# Patient Record
Sex: Male | Born: 1980 | Race: White | Hispanic: No | Marital: Married | Smoking: Current every day smoker
Health system: Southern US, Community
[De-identification: ages and names within clinical notes are randomized; demographics above are authoritative.]

---
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
## 2001-09-09 ENCOUNTER — Emergency Department (HOSPITAL_COMMUNITY): Admission: EM | Admit: 2001-09-09 | Discharge: 2001-09-09 | Payer: Self-pay | Admitting: Emergency Medicine

## 2001-09-09 ENCOUNTER — Encounter: Payer: Self-pay | Admitting: Emergency Medicine

## 2002-02-04 ENCOUNTER — Emergency Department (HOSPITAL_COMMUNITY): Admission: EM | Admit: 2002-02-04 | Discharge: 2002-02-04 | Payer: Self-pay | Admitting: Emergency Medicine

## 2002-02-04 ENCOUNTER — Encounter: Payer: Self-pay | Admitting: Emergency Medicine

## 2002-07-12 ENCOUNTER — Emergency Department (HOSPITAL_COMMUNITY): Admission: EM | Admit: 2002-07-12 | Discharge: 2002-07-12 | Payer: Self-pay | Admitting: Emergency Medicine

## 2002-07-12 ENCOUNTER — Encounter: Payer: Self-pay | Admitting: Emergency Medicine

## 2010-01-24 ENCOUNTER — Emergency Department (HOSPITAL_COMMUNITY)
Admission: EM | Admit: 2010-01-24 | Discharge: 2010-01-25 | Payer: Self-pay | Source: Home / Self Care | Admitting: Emergency Medicine

## 2010-02-02 LAB — GLUCOSE, CAPILLARY: Glucose-Capillary: 92 mg/dL (ref 70–99)

## 2010-02-02 LAB — ETHANOL: Alcohol, Ethyl (B): 104 mg/dL — ABNORMAL HIGH (ref 0–10)

## 2010-02-02 LAB — POCT I-STAT, CHEM 8
BUN: 5 mg/dL — ABNORMAL LOW (ref 6–23)
Calcium, Ion: 1.1 mmol/L — ABNORMAL LOW (ref 1.12–1.32)
Chloride: 108 mEq/L (ref 96–112)
Creatinine, Ser: 1 mg/dL (ref 0.4–1.5)
Glucose, Bld: 95 mg/dL (ref 70–99)
HCT: 45 % (ref 39.0–52.0)
Hemoglobin: 15.3 g/dL (ref 13.0–17.0)
Potassium: 3.7 mEq/L (ref 3.5–5.1)
Sodium: 142 mEq/L (ref 135–145)
TCO2: 22 mmol/L (ref 0–100)

## 2010-08-24 ENCOUNTER — Inpatient Hospital Stay (HOSPITAL_COMMUNITY)
Admission: EM | Admit: 2010-08-24 | Discharge: 2010-08-27 | DRG: 101 | Disposition: A | Discharge status: Home / Self Care | Payer: Medicaid Other | Attending: Internal Medicine | Admitting: Internal Medicine

## 2010-08-24 DIAGNOSIS — R21 Rash and other nonspecific skin eruption: Secondary | ICD-10-CM | POA: Diagnosis present

## 2010-08-24 DIAGNOSIS — M6282 Rhabdomyolysis: Secondary | ICD-10-CM | POA: Diagnosis present

## 2010-08-24 DIAGNOSIS — Z91199 Patient's noncompliance with other medical treatment and regimen due to unspecified reason: Secondary | ICD-10-CM

## 2010-08-24 DIAGNOSIS — Z9119 Patient's noncompliance with other medical treatment and regimen: Secondary | ICD-10-CM

## 2010-08-24 DIAGNOSIS — R569 Unspecified convulsions: Principal | ICD-10-CM | POA: Diagnosis present

## 2010-08-25 LAB — COMPREHENSIVE METABOLIC PANEL
ALT: 21 U/L (ref 0–53)
AST: 16 U/L (ref 0–37)
Albumin: 3.9 g/dL (ref 3.5–5.2)
Alkaline Phosphatase: 96 U/L (ref 39–117)
BUN: 6 mg/dL (ref 6–23)
CO2: 23 mEq/L (ref 19–32)
Calcium: 8.9 mg/dL (ref 8.4–10.5)
Chloride: 107 mEq/L (ref 96–112)
Creatinine, Ser: 0.88 mg/dL (ref 0.50–1.35)
GFR calc Af Amer: >60 mL/min (ref >60)
GFR calc non Af Amer: >60 mL/min (ref >60)
Glucose, Bld: 118 mg/dL — ABNORMAL HIGH (ref 70–99)
Potassium: 3.4 mEq/L — ABNORMAL LOW (ref 3.5–5.1)
Sodium: 143 mEq/L (ref 135–145)
Total Bilirubin: 0.2 mg/dL — ABNORMAL LOW (ref 0.3–1.2)
Total Protein: 7.2 g/dL (ref 6.0–8.3)

## 2010-08-25 LAB — GLUCOSE, CAPILLARY: Glucose-Capillary: 129 mg/dL — ABNORMAL HIGH (ref 70–99)

## 2010-08-25 LAB — DIFFERENTIAL
Basophils Absolute: 0.1 10*3/uL (ref 0.0–0.1)
Basophils Relative: 0 % (ref 0–1)
Eosinophils Absolute: 0.1 10*3/uL (ref 0.0–0.7)
Eosinophils Relative: 1 % (ref 0–5)
Lymphocytes Relative: 21 % (ref 12–46)
Lymphs Abs: 2.6 10*3/uL (ref 0.7–4.0)
Monocytes Absolute: 0.7 10*3/uL (ref 0.1–1.0)
Monocytes Relative: 6 % (ref 3–12)
Neutro Abs: 8.7 10*3/uL — ABNORMAL HIGH (ref 1.7–7.7)
Neutrophils Relative %: 72 % (ref 43–77)

## 2010-08-25 LAB — POCT I-STAT, CHEM 8
BUN: 4 mg/dL — ABNORMAL LOW (ref 6–23)
Calcium, Ion: 1.1 mmol/L — ABNORMAL LOW (ref 1.12–1.32)
Chloride: 106 mEq/L (ref 96–112)
Creatinine, Ser: 1.2 mg/dL (ref 0.50–1.35)
Glucose, Bld: 115 mg/dL — ABNORMAL HIGH (ref 70–99)
HCT: 44 % (ref 39.0–52.0)
Hemoglobin: 15 g/dL (ref 13.0–17.0)
Potassium: 3.5 mEq/L (ref 3.5–5.1)
Sodium: 144 mEq/L (ref 135–145)
TCO2: 21 mmol/L (ref 0–100)

## 2010-08-25 LAB — CBC
HCT: 40 % (ref 39.0–52.0)
HCT: 41.8 % (ref 39.0–52.0)
Hemoglobin: 13.9 g/dL (ref 13.0–17.0)
Hemoglobin: 14.9 g/dL (ref 13.0–17.0)
MCH: 30.7 pg (ref 26.0–34.0)
MCH: 31 pg (ref 26.0–34.0)
MCHC: 34.8 g/dL (ref 30.0–36.0)
MCHC: 35.6 g/dL (ref 30.0–36.0)
MCV: 87.1 fL (ref 78.0–100.0)
MCV: 88.3 fL (ref 78.0–100.0)
Platelets: 316 10*3/uL (ref 150–400)
Platelets: 325 10*3/uL (ref 150–400)
RBC: 4.53 MIL/uL (ref 4.22–5.81)
RBC: 4.8 MIL/uL (ref 4.22–5.81)
RDW: 12.9 % (ref 11.5–15.5)
RDW: 13.2 % (ref 11.5–15.5)
WBC: 12.2 10*3/uL — ABNORMAL HIGH (ref 4.0–10.5)
WBC: 13.7 10*3/uL — ABNORMAL HIGH (ref 4.0–10.5)

## 2010-08-25 LAB — ETHANOL: Alcohol, Ethyl (B): 152 mg/dL — ABNORMAL HIGH (ref 0–11)

## 2010-08-25 LAB — BASIC METABOLIC PANEL
BUN: 8 mg/dL (ref 6–23)
CO2: 27 mEq/L (ref 19–32)
Calcium: 9.1 mg/dL (ref 8.4–10.5)
Chloride: 106 mEq/L (ref 96–112)
Creatinine, Ser: 0.82 mg/dL (ref 0.50–1.35)
GFR calc Af Amer: >60 mL/min (ref >60)
GFR calc non Af Amer: >60 mL/min (ref >60)
Glucose, Bld: 95 mg/dL (ref 70–99)
Potassium: 4 mEq/L (ref 3.5–5.1)
Sodium: 142 mEq/L (ref 135–145)

## 2010-08-25 LAB — CK
Total CK: 105 U/L (ref 7–232)
Total CK: 107 U/L (ref 7–232)

## 2010-08-25 LAB — PHENYTOIN LEVEL, TOTAL: Phenytoin Lvl: 9.6 ug/mL — ABNORMAL LOW (ref 10.0–20.0)

## 2010-08-25 LAB — VALPROIC ACID LEVEL
Valproic Acid Lvl: <10 ug/mL — ABNORMAL LOW (ref 50.0–100.0)
Valproic Acid Lvl: <10 ug/mL — ABNORMAL LOW (ref 50.0–100.0)

## 2010-08-25 LAB — LACTIC ACID, PLASMA
Lactic Acid, Venous: 1.2 mmol/L (ref 0.5–2.2)
Lactic Acid, Venous: 3.6 mmol/L — ABNORMAL HIGH (ref 0.5–2.2)

## 2010-08-26 LAB — DRUGS OF ABUSE SCREEN W/O ALC, ROUTINE URINE
Amphetamine Screen, Ur: NEGATIVE
Barbiturate Quant, Ur: NEGATIVE
Benzodiazepines.: POSITIVE — AB
Cocaine Metabolites: POSITIVE — AB
Creatinine,U: 123.7 mg/dL
Marijuana Metabolite: NEGATIVE
Methadone: NEGATIVE
Opiate Screen, Urine: NEGATIVE
Phencyclidine (PCP): NEGATIVE
Propoxyphene: NEGATIVE

## 2010-08-26 LAB — VALPROIC ACID LEVEL: Valproic Acid Lvl: <10 ug/mL — ABNORMAL LOW (ref 50.0–100.0)

## 2010-08-27 DIAGNOSIS — F329 Major depressive disorder, single episode, unspecified: Secondary | ICD-10-CM

## 2010-08-28 LAB — BENZODIAZEPINE, QUANTITATIVE, URINE
Alprazolam (GC/LC/MS), ur confirm: NEGATIVE NG/ML
Nordiazepam GC/MS Conf: NEGATIVE NG/ML
Oxazepam GC/MS Conf: NEGATIVE NG/ML
Temazepam GC/MS Conf: 188 NG/ML — ABNORMAL HIGH

## 2010-08-28 LAB — COCAINE, URINE, CONFIRMATION: Benzoylecgonine GC/MS Conf: 7850 NG/ML — ABNORMAL HIGH

## 2010-08-31 NOTE — Consult Note (Signed)
[name], Vargas NO.:  [id number]  MEDICAL RECORD NO.:  [id number]  LOCATION:  4713                         FACILITY:  [facility]  PHYSICIAN:  Thana Farr, MD    DATE OF BIRTH:  October 06, 1980  DATE OF CONSULTATION:  08/25/2010 DATE OF DISCHARGE:                                CONSULTATION   REASON FOR CONSULTATION:  Seizure in a patient with known seizure disorder and noncompliant with medications.  HISTORY OF PRESENT ILLNESS:  This is a 30 year old Caucasian male with a past medical history of seizure diagnosed approximately 8 months ago by Dr. Terrace Arabia.  The patient has been seen by Dr. Terrace Arabia as an outpatient and was initially placed on 500 mg extended release daily, however, was still showing seizure activity.  On followup visit, the patient's dose was increased to 500 mg extended release b.i.d. by Dr. Terrace Arabia.  The patient was taking the medication up until approximately 3 months ago.  The patient stopped his medication secondary to feeling extremely tired and slightly dizzy.  For 3 months, the patient has been off his medication and states that he has been seizure-free.  Today, the patient returns to the hospital after having multiple seizures at home.  When asked to describe the seizure activity, the patient's girlfriend is unable to explain exactly what happened but states that he did have staring spells and was stiff.  The time-frame is not clear but there is a question of this seizure activity lasting between 10 minutes to an hour.  The patient at this time is alert and coherent showing no seizure activity.  The valproic acid level on admission was less than 10.  PAST MEDICAL HISTORY:  Seizure.  MEDICATIONS:  I have called [facility] and they have told me his Depakote level when last seen in the office was 500 mg extended release Depakote b.i.d. His last office visit was in April 2012.  ALLERGIES:  No known drug allergies.  SOCIAL HISTORY:  The patient is in  [employer].  He does not smoke, drink, or do illicit drugs.  REVIEW OF SYSTEMS:  Positive for headache, otherwise negative in all 12 system reviews.  PHYSICAL EXAMINATION:  VITAL SIGNS:  Blood pressure is 114/65, pulse 95, respiration 18, and temperature 98.4. NEUROLOGIC:  The patient is alert and oriented x3.  Carries out 2 and 3- step commands with difficulty.  Pupils are equal, round, and react to light and accommodating. Conjugate.  Extraocular movements are intact. Visual fields are grossly intact.  Face is symmetrical.  Tongue is midline.  Uvula is midline.  The patient shows no dysarthria, no aphasia.  The facial sensation is full to pinprick and light touch. Shoulder shrug and head turn is within normal limits.  Coordination: Finger-to-nose and heel-to-shin are smooth.  Motor:  The patient shows 5/5 strength throughout the upper extremities and lower extremities bilaterally.  He has normal tone and bulk.  Deep tendon reflexes are 2+ throughout downgoing toes.  Drift:  Negative drift in the upper and lower extremities.  Sensation is full to pinprick and light touch throughout. PULMONARY:  Clear to auscultation. CARDIOVASCULAR:  S1-S2 is regular. NECK:  Negative for bruits.  LABORATORY DATA:  Sodium is 143, potassium 3.4, chloride 107, CO2 is 23, BUN is 6, creatinine 0.8, and glucose is 118.  White blood cell count is 12.2, hemoglobin 14.9, hematocrit 41.8, and platelets are 325.  IMAGING:  CT of head is within normal limits.  ASSESSMENT/PLAN:  This is a 30 year old male with no seizure disorder who has been noncompliant with his medications over the past 3 months now presenting with a seizure.  The patient has been loaded with Depakote 1 gram IV now.  Our recommendations at this time are to start lamictal 50 mg bid as he states he felt Depakote was causing side effects when he was taking the medication.  He is to follow up at [facility] with Dr. Terrace Arabia  within the next 2 weeks to further evaluate medication dosage and also antiepileptic drug preference as the patient states he dislikes the side effects of Depakote.  I have discussed these findings with Dr. Thad Ranger and she agrees with the above-mentioned.     Felicie Morn, PA-C   ______________________________ Thana Farr, MD    DS/MEDQ  D:  08/25/2010  T:  08/25/2010  Job:  [id number]  Electronically Signed by Felicie Morn PA-C on 08/26/2010 10:36:19 AM Electronically Signed by Thana Farr MD on 08/31/2010 01:14:51 AM

## 2010-09-01 NOTE — Discharge Summary (Signed)
  Jeffrey Vargas, Jeffrey Vargas NO.:  [id number]  MEDICAL RECORD NO.:  [id number]  LOCATION:  3031                         FACILITY:  [facility]  PHYSICIAN:  Lonia Blood, M.D.       DATE OF BIRTH:  1980-03-10  DATE OF ADMISSION:  08/24/2010 DATE OF DISCHARGE:  08/27/2010                              DISCHARGE SUMMARY   PRIMARY CARE PHYSICIAN:  [facility].  DISCHARGE DIAGNOSES: 1. 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 2. Depression and multiple social stressors. 3. Alcohol use.  DISCHARGE MEDICATIONS: 1. Keppra 500 grams twice a day. 2. Celexa 20 mg daily.  CONDITION ON DISCHARGE:  The patient was discharged in good condition, alert and oriented, no acute distress.  Temperature 98.0, heart rate 70, respirations 19, blood pressure 113/77, saturation 98% on room air.  The patient will follow up with his primary care physician.  PROCEDURE DURING THIS ADMISSION:  No procedures done.  CONSULTATION:  The patient was seen in consultation by Dr. Thana Farr from Neurology.  HISTORY AND PHYSICAL:  Refer to dictated H and P done by Dr. Conley Rolls.  HOSPITAL COURSE:  Mr. Weekly, 30 year old gentleman with reported history of seizures, was presented to emergency room with what seemed to be a recurrent seizure events.  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  He was placed initially on Lamictal and Dilantin, but then he developed a mild rash.  He was taken off of the Lamictal and Dilantin and placed on Keppra.  The patient was seen in consultation by Dr. Rogers Blocker from Psychiatry, who recommended Celexa 20 mg daily for depression.  On the night of August 27, 2010, the patient requested to be discharged as he was seizure-free for 24 hours and feeling better.  He was told not to drive and to follow up closely with  his primary care physician.  In the long run, if he does not have any seizure in 6 months, his Keppra should be discontinued.     Lonia Blood, M.D.     SL/MEDQ  D:  08/28/2010  T:  08/28/2010  Job:  [id number]  cc:   [facility].  Electronically Signed by Lonia Blood M.D. on 09/01/2010 05:45:29 PM

## 2010-09-09 NOTE — H&P (Signed)
Jeffrey Vargas, Jeffrey Vargas NO.:  [id number]  MEDICAL RECORD NO.:  [id number]  LOCATION:  [facility]                         FACILITY:  [facility]  PHYSICIAN:  Houston Siren, MD           DATE OF BIRTH:  28-Jun-1980  DATE OF ADMISSION:  08/24/2010 DATE OF DISCHARGE:                             HISTORY & PHYSICAL   PRIMARY CARE PHYSICIAN:  None.  REASON FOR ADMISSION:  Status epilepticus.  ADVANCE DIRECTIVES:  Full code.  HISTORY OF PRESENT ILLNESS:  This is a 30 year old male with history of seizure, noncompliant with his medications, last seizure 3 months ago, stopped all his medications, presents in status epilepticus for 45 minutes.  He was given Valium 5 mg IV x2 and when he was in the emergency room, another Ativan 4 mg in total was given as well.  He was in postictal, but when I saw him he became alert and conversing.  He was loaded with 1000 mg of Dilantin IV.  Other workup included an alcohol level of greater than 150.  Lactic acid of 3.6, creatinine of 0.88, Depakote less than 10, white count of 12.2,000 and hemoglobin of 14.9. Hospitalist was asked to admit the patient for observation.  PAST MEDICAL HISTORY:  Seizure.  MEDICATIONS:  Depakote 500 mg extended release once a day.  SOCIAL HISTORY:  He worked in [employer].  He is married.  He deny regular alcohol use or any drug use.  FAMILY HISTORY:  Noncontributory.  REVIEW OF SYSTEMS:  Otherwise unremarkable.  He does have mild headache.  PHYSICAL EXAMINATION:  VITAL SIGNS:  Blood pressure 108/56, pulse of 90, respiratory rate of 19, temperature 98.1 rectally. GENERAL:  He is alert and oriented and conversing.  He know he is at [facility] and able to carry meaningful conversation, but he does appear to be fatigued. HEENT:  Pupils equal, round and reactive bilaterally.  Sclerae are nonicteric.  Throat is clear. NECK:  Supple.  Uvula elevated with phonation.  Speech is fluent.  No carotid bruit. CARDIAC:  S1,  S2 regular.  I did not hear any murmur, rub or gallop. LUNGS:  Clear.  No wheezes, rales or any evidence of consolidation. ABDOMEN:  Soft, nondistended, nontender.  No hepatosplenomegaly. EXTREMITIES:  No edema.  No calf tenderness.  Good distal pulses bilaterally. SKIN:  Warm and dry. NEUROLOGIC:  Nonfocal.  He has good strength bilaterally. PSYCHIATRIC:  Unremarkable as well.  OBJECTIVE FINDINGS:  As above.  Pertinent finding is alcohol level greater than 150, Depakote was less than 10, creatinine is 0.88.  White count of 12.2,000.  IMPRESSION:  This is a 30 year old male in status epilepticus due to noncompliance.  Apparently, he has not had seizure for 3 months and decided that he does not need his Depakote, so he stopped.  We will admit him to telemetry unit for observation.  He is a full code.  We will admit him to [facility].  Dilantin was given as a loading dose and I suspect that this would hold him and his Depakote will be resumed at 500 mg b.i.d.  I will try to avoid given him the loading dose  of Depakote.  If he has an acute seizure in the hospital, we will give benzodiazepine.  I reiterate to him the importance of being compliant and obviously, he cannot drive.  He is stable otherwise, and we will get a CPK level along with giving him intravenous fluid in case he has rhabdomyolysis from his prolonged seizure, which I suspect he will.     Houston Siren, MD     PL/MEDQ  D:  08/25/2010  T:  08/25/2010  Job:  [id number]  Electronically Signed by Houston Siren  on 09/09/2010 05:59:33 AM

## 2011-02-22 ENCOUNTER — Emergency Department (HOSPITAL_COMMUNITY)
Admission: EM | Admit: 2011-02-22 | Discharge: 2011-02-22 | Disposition: A | Discharge status: Home / Self Care | Payer: Medicaid Other | Attending: Emergency Medicine | Admitting: Emergency Medicine

## 2011-02-22 ENCOUNTER — Encounter (HOSPITAL_COMMUNITY): Payer: Self-pay

## 2011-02-22 DIAGNOSIS — M79609 Pain in unspecified limb: Secondary | ICD-10-CM | POA: Insufficient documentation

## 2011-02-22 DIAGNOSIS — J45909 Unspecified asthma, uncomplicated: Secondary | ICD-10-CM | POA: Insufficient documentation

## 2011-02-22 DIAGNOSIS — M545 Low back pain, unspecified: Secondary | ICD-10-CM | POA: Insufficient documentation

## 2011-02-22 MED ORDER — IBUPROFEN 800 MG PO TABS: 800.0000 mg | ORAL_TABLET | Freq: Three times a day (TID) | ORAL | Status: AC

## 2011-02-22 MED ORDER — HYDROMORPHONE HCL PF 1 MG/ML IJ SOLN
1.0000 mg | Freq: Once | INTRAMUSCULAR | Status: AC
  Administered 2011-02-22: 1 mg via INTRAMUSCULAR
  Filled 2011-02-22: qty 1

## 2011-02-22 MED ORDER — KETOROLAC TROMETHAMINE 60 MG/2ML IM SOLN
60.0000 mg | Freq: Once | INTRAMUSCULAR | Status: AC
  Administered 2011-02-22: 60 mg via INTRAMUSCULAR
  Filled 2011-02-22: qty 2

## 2011-02-22 MED ORDER — OXYCODONE HCL 5 MG PO TABS: 5.0000 mg | ORAL_TABLET | ORAL | Status: AC | PRN

## 2011-02-22 NOTE — ED Notes (Signed)
Pt getting dressed to be discharged

## 2011-02-22 NOTE — ED Provider Notes (Signed)
History     CSN: [id number]  Arrival date & time 02/22/11  1341   First MD Initiated Contact with Patient 02/22/11 1515      Chief Complaint  Patient presents with  . Back Pain    (Consider location/radiation/quality/duration/timing/severity/associated sxs/prior treatment) HPI History provided by pt.   Pt presents w/ mid-line low back pain x 2 weeks.  When he bends over or attempts to lift something heavy, he feels a pinching sensation and pain radiates down the lateral aspect of his right leg.  Denies fever, bladder/bowel dysfunction and lower extremity weakness/parasthesias.   Has taken aspirin w/out relief.  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     Past Medical History  Diagnosis Date  . Asthma     History reviewed. No pertinent past surgical history.  History reviewed. No pertinent family history.  History  Substance Use Topics  . Smoking status: Current Everyday Smoker  . Smokeless tobacco: Not on file  . Alcohol Use: No      Review of Systems  All other systems reviewed and are negative.    Allergies  Tylenol  Home Medications   Current Outpatient Rx  Name Route Sig Dispense Refill  . ALBUTEROL SULFATE HFA 108 (90 BASE) MCG/ACT IN AERS Inhalation Inhale 2 puffs into the lungs every 6 (six) hours as needed. For wheezing      BP 134/91  Pulse 83  Temp(Src) 98.2 F (36.8 C) (Oral)  Resp 14  SpO2 97%  Physical Exam  Nursing note and vitals reviewed. Constitutional: He is oriented to person, place, and time. He appears well-developed and well-nourished.  HENT:  Head: Normocephalic and atraumatic.  Eyes:       Normal appearance  Neck: Normal range of motion.  Cardiovascular: Normal rate and regular rhythm.   Pulmonary/Chest: Effort normal and breath sounds normal.  Abdominal: Soft. Bowel sounds are normal. He exhibits no distension.       Mild tenderness lower abd  Musculoskeletal:       Lumbar spinal and paraspinal ttp. No CVA ttp. Full  ROM of LE.  Nml patellar reflexes.  No saddle anesthesia. Distal sensation intact.  2+ DP pulses.   Neurological: He is alert and oriented to person, place, and time.  Skin: Skin is warm and dry. No rash noted.  Psychiatric: He has a normal mood and affect. His behavior is normal.    ED Course  Procedures (including critical care time)  Labs Reviewed - No data to display No results found.   1. Low back pain       MDM  Pt presents w/ low back pain w/ radiation down RLE x 2 weeks.  Attributes to heavy lifting at work. No back pain red flag s/sx.  Pt received IM toradol and dilaudid and pain much improved.  He is ambulating w/out difficulty.  D/c'd home w/ prescription for ibuprofen and oxycodone (allergic to tylenol) and referral to NS for persistent/worsening sx.  Return precautions discussed.         Arie Sabina [place], [place] 02/22/11 [phone]

## 2011-02-22 NOTE — ED Notes (Signed)
Retrieved pt from triage waiting area; pt getting into a gown

## 2011-02-22 NOTE — ED Notes (Signed)
Pt complains of back pain for two weeks. 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

## 2011-02-22 NOTE — ED Provider Notes (Signed)
Medical screening examination/treatment/procedure(s) were performed by non-physician practitioner and as supervising physician I was immediately available for consultation/collaboration.  Shaynna Husby, MD 02/22/11 1942 

## 2011-08-17 ENCOUNTER — Encounter (HOSPITAL_COMMUNITY): Payer: Self-pay | Admitting: Emergency Medicine

## 2011-08-17 ENCOUNTER — Emergency Department (HOSPITAL_COMMUNITY)
Admission: EM | Admit: 2011-08-17 | Discharge: 2011-08-17 | Disposition: A | Discharge status: Home / Self Care | Payer: Medicaid Other | Attending: Emergency Medicine | Admitting: Emergency Medicine

## 2011-08-17 DIAGNOSIS — M545 Low back pain, unspecified: Secondary | ICD-10-CM | POA: Insufficient documentation

## 2011-08-17 DIAGNOSIS — M549 Dorsalgia, unspecified: Secondary | ICD-10-CM

## 2011-08-17 MED ORDER — HYDROMORPHONE HCL PF 1 MG/ML IJ SOLN
1.0000 mg | Freq: Once | INTRAMUSCULAR | Status: AC
  Administered 2011-08-17: 1 mg via INTRAMUSCULAR
  Filled 2011-08-17: qty 1

## 2011-08-17 MED ORDER — NAPROXEN 500 MG PO TABS: 500.0000 mg | ORAL_TABLET | Freq: Two times a day (BID) | ORAL | Status: DC | PRN

## 2011-08-17 MED ORDER — DIAZEPAM 5 MG PO TABS: 5.0000 mg | ORAL_TABLET | Freq: Three times a day (TID) | ORAL | Status: DC | PRN

## 2011-08-17 MED ORDER — KETOROLAC TROMETHAMINE 15 MG/ML IJ SOLN
15.0000 mg | Freq: Once | INTRAMUSCULAR | Status: AC
  Administered 2011-08-17: 15 mg via INTRAMUSCULAR
  Filled 2011-08-17: qty 1

## 2011-08-17 MED ORDER — KETOROLAC TROMETHAMINE 30 MG/ML IJ SOLN
INTRAMUSCULAR | Status: AC
  Filled 2011-08-17: qty 1

## 2011-08-17 MED ORDER — DIAZEPAM 5 MG PO TABS
5.0000 mg | ORAL_TABLET | Freq: Once | ORAL | Status: AC
  Administered 2011-08-17: 5 mg via ORAL
  Filled 2011-08-17: qty 1

## 2011-08-17 MED ORDER — OXYCODONE-ACETAMINOPHEN 5-325 MG PO TABS: 1.0000 | ORAL_TABLET | ORAL | Status: AC | PRN

## 2011-08-17 NOTE — ED Provider Notes (Signed)
History    This chart was scribed for Raeford Razor, MD, MD by Smitty Pluck. The patient was seen in room [facility] and the patient's care was started at 4:23PM.   CSN: [id number]  Arrival date & time 08/17/11  1517   First MD Initiated Contact with Patient 08/17/11 1556      Chief Complaint  Patient presents with  . Back Pain    (Consider location/radiation/quality/duration/timing/severity/associated sxs/prior treatment) Patient is a 31 y.o. male presenting with back pain. The history is provided by the patient.  Back Pain    Jeffrey Vargas is a 31 y.o. male who presents to the Emergency Department complaining of constant, moderate lower back pain onset 1 day ago. Pt reports that he was at work lifting and noticed the pain. Pt denies hx of similar pain. Pt reports radiation to right leg. Denies hx of back surgery, fevers, chills, hx of IV drug use, incontinence and dysuria. Pt has taken ibuprofen without relief.  Past Medical History  Diagnosis Date  . Asthma     History reviewed. No pertinent past surgical history.  History reviewed. No pertinent family history.  History  Substance Use Topics  . Smoking status: Current Everyday Smoker  . Smokeless tobacco: Not on file  . Alcohol Use: Yes      Review of Systems  Musculoskeletal: Positive for back pain.  All other systems reviewed and are negative.   10 Systems reviewed and all are negative for acute change except as noted in the HPI.   Allergies  Tylenol  Home Medications   Current Outpatient Rx  Name Route Sig Dispense Refill  . ALBUTEROL SULFATE HFA 108 (90 BASE) MCG/ACT IN AERS Inhalation Inhale 2 puffs into the lungs every 6 (six) hours as needed. For wheezing      BP 137/101  Pulse 113  Temp 98.3 F (36.8 C) (Oral)  Resp 18  SpO2 97%  Physical Exam  Nursing note and vitals reviewed. Constitutional: He appears well-developed and well-nourished. No distress.  HENT:  Head: Normocephalic and  atraumatic.  Eyes: Conjunctivae are normal. Right eye exhibits no discharge. Left eye exhibits no discharge.  Neck: Neck supple.  Cardiovascular: Regular rhythm and normal heart sounds.  Tachycardia present.  Exam reveals no gallop and no friction rub.   No murmur heard. Pulmonary/Chest: Effort normal and breath sounds normal. No respiratory distress.  Abdominal: Soft. He exhibits no distension. There is no tenderness.  Musculoskeletal:       Mild left lumbar paraspinal tenderness Sensation intact to light touch Strength 5/5 to lower extremities   Neurological: He is alert.  Skin: Skin is warm and dry.  Psychiatric: He has a normal mood and affect. His behavior is normal. Thought content normal.    ED Course  Procedures (including critical care time) DIAGNOSTIC STUDIES: Oxygen Saturation is 97% on room air, normal by my interpretation.    COORDINATION OF CARE: 4:25PM EDP discusses pt ED treatment with pt  4:25PM EDP ordered medication:  Scheduled Meds:   . diazepam  5 mg Oral Once  .  HYDROmorphone (DILAUDID) injection  1 mg Intramuscular Once  . ketorolac  15 mg Intramuscular Once  . ketorolac       Continuous Infusions:  PRN Meds:.    Labs Reviewed - No data to display No results found.   1. Back pain       MDM  31yM with back pain. No evidence of cord impingement or other "red flags" concerning back  pain. No indication for imaging. Plan symptomatic tx. Return precautions discussed. Outpt fu.   I personally preformed the services scribed in my presence. The recorded information has been reviewed and considered. Raeford Razor, MD.    Raeford Razor, MD 08/26/11 [phone]

## 2011-08-17 NOTE — ED Notes (Signed)
Pt c/o lower back pain after working 3 days ago; pt denies obvious injury

## 2011-08-17 NOTE — ED Notes (Signed)
MD at bedside. 

## 2011-08-25 ENCOUNTER — Encounter (HOSPITAL_COMMUNITY): Payer: Self-pay | Admitting: Emergency Medicine

## 2011-08-25 ENCOUNTER — Emergency Department (HOSPITAL_COMMUNITY)
Admission: EM | Admit: 2011-08-25 | Discharge: 2011-08-25 | Disposition: A | Discharge status: Home / Self Care | Payer: Medicaid Other | Attending: Emergency Medicine | Admitting: Emergency Medicine

## 2011-08-25 DIAGNOSIS — F17200 Nicotine dependence, unspecified, uncomplicated: Secondary | ICD-10-CM | POA: Insufficient documentation

## 2011-08-25 DIAGNOSIS — M549 Dorsalgia, unspecified: Secondary | ICD-10-CM

## 2011-08-25 MED ORDER — OXYCODONE-ACETAMINOPHEN 5-325 MG PO TABS: 1.0000 | ORAL_TABLET | ORAL | Status: AC | PRN

## 2011-08-25 MED ORDER — KETOROLAC TROMETHAMINE 60 MG/2ML IM SOLN
60.0000 mg | Freq: Once | INTRAMUSCULAR | Status: AC
  Administered 2011-08-25: 60 mg via INTRAMUSCULAR
  Filled 2011-08-25: qty 2

## 2011-08-25 MED ORDER — HYDROMORPHONE HCL PF 2 MG/ML IJ SOLN
2.0000 mg | Freq: Once | INTRAMUSCULAR | Status: AC
  Administered 2011-08-25: 2 mg via INTRAMUSCULAR
  Filled 2011-08-25: qty 1

## 2011-08-25 MED ORDER — CYCLOBENZAPRINE HCL 10 MG PO TABS
10.0000 mg | ORAL_TABLET | Freq: Once | ORAL | Status: AC
  Administered 2011-08-25: 10 mg via ORAL
  Filled 2011-08-25: qty 1

## 2011-08-25 MED ORDER — CYCLOBENZAPRINE HCL 5 MG PO TABS: 5.0000 mg | ORAL_TABLET | Freq: Three times a day (TID) | ORAL | Status: AC | PRN

## 2011-08-25 NOTE — ED Provider Notes (Signed)
I saw and evaluated the patient, reviewed the resident's note and I agree with the findings and plan.  Improvement in back pain in ER with pain meds. No indication for MRI today. Walked into the ER. Outpatient follow up  Lyanne Co, MD 08/25/11 2153

## 2011-08-25 NOTE — ED Notes (Signed)
Pt c/o lower back pain x 2 weeks; pt sts seen here for same; pt denies new injury

## 2011-08-25 NOTE — ED Provider Notes (Signed)
History     CSN: [id number]  Arrival date & time 08/25/11  4098   First MD Initiated Contact with Patient 08/25/11 0840      Chief Complaint  Patient presents with  . Back Pain    HPI 31 yo male with h/o chronic back pain who presents with worsening lower back pain. Was seen on 08/17/11 for similar complaint and was given percocet, naproxen and valium with exercises. He was told to come back if it did not improve in 1 week, which is why he came to ED today. Pain is on the right side of lower back with radiation to the mid back. Reports some tingling in the right leg with some weakness. No bowel or urinary incontinence. Worst with movement. Has been taking percocet 1-2  Every 6 hours with relief for 2 hours. Denies any fevers or chills. Per medical records, has a remote history of IV drug use, but patient denies any current or recent use. 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 Denies any recent trauma or increased activity.  Denies dysuria, polyuria, hematuria. Denies constipation or diarrhea.  Past Medical History  Diagnosis Date  . Asthma     History reviewed. No pertinent past surgical history.  History reviewed. No pertinent family history.  History  Substance Use Topics  . Smoking status: Current Everyday Smoker  . Smokeless tobacco: Not on file  . Alcohol Use: Yes      Review of Systems  Allergies  Tylenol  Home Medications   Current Outpatient Rx  Name Route Sig Dispense Refill  . IBUPROFEN 200 MG PO TABS Oral Take 200 mg by mouth every 6 (six) hours as needed. For pain    . NAPROXEN 500 MG PO TABS Oral Take 1 tablet (500 mg total) by mouth 2 (two) times daily as needed. 20 tablet 0  . OXYCODONE-ACETAMINOPHEN 5-325 MG PO TABS Oral Take 1-2 tablets by mouth every 4 (four) hours as needed for pain. 12 tablet 0  . CYCLOBENZAPRINE HCL 5 MG PO TABS Oral Take 1 tablet (5 mg total) by mouth 3 (three) times daily as needed for muscle spasms. 30  tablet 0  . OXYCODONE-ACETAMINOPHEN 5-325 MG PO TABS Oral Take 1 tablet by mouth every 4 (four) hours as needed for pain. 30 tablet 0    BP 131/88  Pulse 91  Temp 97.8 F (36.6 C) (Oral)  Resp 16  SpO2 99%  Physical Exam  Constitutional: He is oriented to person, place, and time.       In mild distress from pain  HENT:  Head: Normocephalic and atraumatic.  Eyes: EOM are normal. Pupils are equal, round, and reactive to light.  Cardiovascular: Normal rate, regular rhythm and normal heart sounds.   Pulmonary/Chest: Effort normal and breath sounds normal. No respiratory distress.  Abdominal: Soft. He exhibits no distension. There is no tenderness.  Musculoskeletal:       Tenderness to palpation along low thoracic spine and right lumbar sacral paraspinal muscles. No CVA tenderness  Neurological: He is alert and oriented to person, place, and time. He has normal reflexes. No cranial nerve deficit.       2+ patellar reflexes bilaterally 4+ strength in right knee flexion,extension, dorsiflexion and plantarflexion compared to 5/5 on left. Appears to be secondary to pain. Negative straight leg. Normal sensation to light touch.  Skin: Skin is warm and dry. No rash noted.  Psychiatric: He has a normal mood  and affect. His behavior is normal.    ED Course  Procedures (including critical care time)  Labs Reviewed - No data to display No results found.   1. Back pain       MDM  9:00am: patient seen and evaluated. Control pain with IM dilaudid 2mg , IM toradol 60mg  and flexeril 10mg .  10:00am: pain improved with dilaudid and toradol. Patient clinically stable to be discharged home with close follow up with primary care doctor for chronic back pain. May benefit from physical therapy. No red flags on history or physical exam.  Discharged home with percocet 5/325, naproxen and flexeril 5mg  tid.   Marena Chancy, PGY-1 [facility]       Lonia Skinner, MD 08/25/11  1357

## 2011-11-30 ENCOUNTER — Emergency Department (HOSPITAL_COMMUNITY)
Admission: EM | Admit: 2011-11-30 | Discharge: 2011-11-30 | Disposition: A | Discharge status: Home / Self Care | Payer: Self-pay | Attending: Emergency Medicine | Admitting: Emergency Medicine

## 2011-11-30 ENCOUNTER — Emergency Department (HOSPITAL_COMMUNITY): Payer: Self-pay

## 2011-11-30 ENCOUNTER — Encounter (HOSPITAL_COMMUNITY): Payer: Self-pay | Admitting: *Deleted

## 2011-11-30 DIAGNOSIS — F17200 Nicotine dependence, unspecified, uncomplicated: Secondary | ICD-10-CM | POA: Insufficient documentation

## 2011-11-30 DIAGNOSIS — J45909 Unspecified asthma, uncomplicated: Secondary | ICD-10-CM | POA: Insufficient documentation

## 2011-11-30 DIAGNOSIS — G8929 Other chronic pain: Secondary | ICD-10-CM | POA: Insufficient documentation

## 2011-11-30 DIAGNOSIS — M549 Dorsalgia, unspecified: Secondary | ICD-10-CM | POA: Insufficient documentation

## 2011-11-30 LAB — URINALYSIS, ROUTINE W REFLEX MICROSCOPIC
Ketones, ur: NEGATIVE mg/dL
Leukocytes, UA: NEGATIVE
Nitrite: NEGATIVE
Protein, ur: NEGATIVE mg/dL
Urobilinogen, UA: 0.2 mg/dL (ref 0.0–1.0)

## 2011-11-30 MED ORDER — HYDROMORPHONE HCL PF 2 MG/ML IJ SOLN
2.0000 mg | Freq: Once | INTRAMUSCULAR | Status: AC
  Administered 2011-11-30: 2 mg via INTRAMUSCULAR
  Filled 2011-11-30: qty 1

## 2011-11-30 MED ORDER — OXYCODONE-ACETAMINOPHEN 5-325 MG PO TABS: 2.0000 | ORAL_TABLET | ORAL | Status: DC | PRN

## 2011-11-30 NOTE — ED Provider Notes (Signed)
History     CSN: [id number]  Arrival date & time 11/30/11  1116   First MD Initiated Contact with Patient 11/30/11 1405      Chief Complaint  Patient presents with  . Back Pain    (Consider location/radiation/quality/duration/timing/severity/associated sxs/prior treatment) Patient is a 31 y.o. male presenting with back pain. The history is provided by the patient.  Back Pain  Pertinent negatives include no fever, no headaches, no abdominal pain, no dysuria and no weakness.   31 year old, male, with no significant past medical history presents emergency department complaining of chronic back pain.  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  He also has to work on his knees frequently.  He denies trauma.  He denies weight loss, or fever.  He denies weakness in his lower extremities.  He has not had bowel or bladder incontinence.  Past Medical History  Diagnosis Date  . Asthma     History reviewed. No pertinent past surgical history.  No family history on file.  History  Substance Use Topics  . Smoking status: Current Every Day Smoker  . Smokeless tobacco: Not on file  . Alcohol Use: Yes      Review of Systems  Constitutional: Negative for fever, chills, diaphoresis and unexpected weight change.  HENT: Negative for neck pain.   Gastrointestinal: Negative for nausea, vomiting and abdominal pain.  Genitourinary: Negative for dysuria, hematuria and difficulty urinating.  Musculoskeletal: Positive for back pain.  Skin: Negative for rash.  Neurological: Negative for weakness and headaches.  Hematological: Does not bruise/bleed easily.  All other systems reviewed and are negative.    Allergies  Watermelon flavor and Tylenol  Home Medications   Current Outpatient Rx  Name  Route  Sig  Dispense  Refill  . IBUPROFEN 200 MG PO TABS   Oral   Take 200 mg by mouth every 6 (six) hours as needed. For pain           BP 149/92  Pulse 80  Temp 98.1 F (36.7 C) (Oral)   Resp 22  SpO2 100%  Physical Exam  Nursing note and vitals reviewed. Constitutional: He is oriented to person, place, and time. He appears well-developed and well-nourished. No distress.  HENT:  Head: Atraumatic.  Eyes: Conjunctivae normal are normal.  Neck: Normal range of motion. Neck supple.  Pulmonary/Chest: Effort normal. No respiratory distress.  Abdominal: He exhibits no distension. There is no tenderness.  Musculoskeletal: Normal range of motion. He exhibits tenderness. He exhibits no edema.       Lower thoracic, and upper lumbar spine tenderness, without any step-offs.  Neurological: He is alert and oriented to person, place, and time. No cranial nerve deficit.       Strength 5 over 5 in bilateral lower extremities  Skin: Skin is dry.  Psychiatric: He has a normal mood and affect. Thought content normal.    ED Course  Procedures (including critical care time)   Labs Reviewed  URINALYSIS, ROUTINE W REFLEX MICROSCOPIC   No results found.   No diagnosis found.    MDM  Back pain        Cheri Guppy, MD 11/30/11 1423

## 2011-11-30 NOTE — ED Notes (Signed)
Pt is here with mid back pain that he has had off and on for one year.  Pt has never had xrays.  Pt states voided on self last nite and has never done that before.  Denies injury.  Pt reports pain in back with coughing

## 2012-02-13 ENCOUNTER — Emergency Department (HOSPITAL_COMMUNITY)
Admission: EM | Admit: 2012-02-13 | Discharge: 2012-02-13 | Disposition: A | Discharge status: Home / Self Care | Payer: Self-pay | Attending: Emergency Medicine | Admitting: Emergency Medicine

## 2012-02-13 ENCOUNTER — Encounter (HOSPITAL_COMMUNITY): Payer: Self-pay | Admitting: *Deleted

## 2012-02-13 DIAGNOSIS — J45909 Unspecified asthma, uncomplicated: Secondary | ICD-10-CM | POA: Insufficient documentation

## 2012-02-13 DIAGNOSIS — Z79899 Other long term (current) drug therapy: Secondary | ICD-10-CM | POA: Insufficient documentation

## 2012-02-13 DIAGNOSIS — M549 Dorsalgia, unspecified: Secondary | ICD-10-CM

## 2012-02-13 DIAGNOSIS — F17200 Nicotine dependence, unspecified, uncomplicated: Secondary | ICD-10-CM | POA: Insufficient documentation

## 2012-02-13 DIAGNOSIS — M5430 Sciatica, unspecified side: Secondary | ICD-10-CM

## 2012-02-13 DIAGNOSIS — M545 Low back pain, unspecified: Secondary | ICD-10-CM | POA: Insufficient documentation

## 2012-02-13 MED ORDER — KETOROLAC TROMETHAMINE 60 MG/2ML IM SOLN
60.0000 mg | Freq: Once | INTRAMUSCULAR | Status: AC
  Administered 2012-02-13: 60 mg via INTRAMUSCULAR
  Filled 2012-02-13: qty 2

## 2012-02-13 MED ORDER — TRAMADOL HCL 50 MG PO TABS: 50.0000 mg | ORAL_TABLET | Freq: Four times a day (QID) | ORAL | Status: AC | PRN

## 2012-02-13 MED ORDER — HYDROMORPHONE HCL PF 2 MG/ML IJ SOLN
2.0000 mg | Freq: Once | INTRAMUSCULAR | Status: AC
  Administered 2012-02-13: 2 mg via INTRAMUSCULAR
  Filled 2012-02-13: qty 1

## 2012-02-13 MED ORDER — NAPROXEN 500 MG PO TABS: 500.0000 mg | ORAL_TABLET | Freq: Two times a day (BID) | ORAL | Status: AC

## 2012-02-13 MED ORDER — HYDROMORPHONE HCL PF 2 MG/ML IJ SOLN: 2.0000 mg | Freq: Once | INTRAMUSCULAR | Status: DC

## 2012-02-13 NOTE — ED Notes (Signed)
Pt reports lower back pain since last night, now has tingling down posterior right leg. No acute distress noted at triage, ambulatory.

## 2012-02-13 NOTE — ED Provider Notes (Signed)
History     CSN: [id number]  Arrival date & time 02/13/12  1134   First MD Initiated Contact with Patient 02/13/12 1218      Chief Complaint  Patient presents with  . Back Pain    (Consider location/radiation/quality/duration/timing/severity/associated sxs/prior treatment) Patient is a 32 y.o. male presenting with back pain. The history is provided by the patient. No language interpreter was used.  Back Pain  This is a recurrent problem. The current episode started 2 days ago. The problem occurs constantly. The problem has been gradually worsening. The pain is associated with no known injury. The pain radiates to the right thigh. The pain is at a severity of 10/10. The pain is moderate. Pertinent negatives include no fever.  32 yo male with c/o R sciatica pain since Friday that is recurrent.  Does not remember re injury.  He was in [place] this month and he was seen in the ER  and given hydrocodone for his pain but ran out a week ago. He's been taking Flexeril and Naprosyn at home for the pain. States that he has had no bowel or bladder incontinence or weakness in his lower extremities.   Past Medical History  Diagnosis Date  . Asthma     History reviewed. No pertinent past surgical history.  History reviewed. No pertinent family history.  History  Substance Use Topics  . Smoking status: Current Every Day Smoker  . Smokeless tobacco: Not on file  . Alcohol Use: Yes      Review of Systems  Constitutional: Negative.  Negative for fever.  HENT: Negative.   Eyes: Negative.   Respiratory: Negative.   Cardiovascular: Negative.   Gastrointestinal: Negative.  Negative for nausea and vomiting.  Genitourinary: Negative for urgency, frequency, difficulty urinating and testicular pain.  Musculoskeletal: Positive for back pain and gait problem.       R hip/thigh tenderness.  Neurological: Negative.  Negative for dizziness and light-headedness.  Psychiatric/Behavioral: Negative.     All other systems reviewed and are negative.    Allergies  Chocolate; Watermelon flavor; and Tylenol  Home Medications   Current Outpatient Rx  Name  Route  Sig  Dispense  Refill  . CYCLOBENZAPRINE HCL 10 MG PO TABS   Oral   Take 10 mg by mouth 4 (four) times daily as needed. For muscle spasms.         Marland Kitchen NAPROXEN PO   Oral   Take 1 tablet by mouth every morning.           BP 137/89  Pulse 105  Temp 97.4 F (36.3 C) (Oral)  Resp 16  SpO2 100%  Physical Exam  Nursing note and vitals reviewed. Constitutional: He is oriented to person, place, and time. He appears well-developed and well-nourished.  HENT:  Head: Normocephalic.  Eyes: Conjunctivae normal and EOM are normal. Pupils are equal, round, and reactive to light.  Neck: Normal range of motion. Neck supple.  Cardiovascular: Normal rate and intact distal pulses.   Pulmonary/Chest: Effort normal.  Abdominal: Soft.  Musculoskeletal: Normal range of motion. He exhibits tenderness.       R hip and posterior thigh pain No stepoff  RLE 5/5 +cms below pain  Neurological: He is alert and oriented to person, place, and time.  Skin: Skin is warm and dry.  Psychiatric: He has a normal mood and affect.    ED Course  Procedures (including critical care time)  Labs Reviewed - No data to display No results  found.   No diagnosis found.    MDM  Sciatica pain to RLE.  Dilaudid im and toradol in the ER.  Rx for naparoxyn and ultram.  Follow up with ortho this week. No weakness or cauda equina symptoms.        Remi Haggard, NP 02/13/12 [phone]

## 2012-02-14 NOTE — ED Provider Notes (Signed)
Medical screening examination/treatment/procedure(s) were performed by non-physician practitioner and as supervising physician I was immediately available for consultation/collaboration.  Marwan T Powers, MD 02/14/12 0849 

## 2012-06-16 ENCOUNTER — Other Ambulatory Visit: Payer: Self-pay | Admitting: Orthopedic Surgery

## 2012-06-16 DIAGNOSIS — M545 Low back pain: Secondary | ICD-10-CM

## 2012-06-21 ENCOUNTER — Ambulatory Visit
Admission: RE | Admit: 2012-06-21 | Discharge: 2012-06-21 | Disposition: A | Discharge status: Home / Self Care | Payer: No Typology Code available for payment source | Source: Ambulatory Visit | Attending: Orthopedic Surgery | Admitting: Orthopedic Surgery

## 2012-06-21 DIAGNOSIS — M545 Low back pain: Secondary | ICD-10-CM

## 2012-10-09 ENCOUNTER — Other Ambulatory Visit: Payer: Self-pay | Admitting: Family Medicine

## 2012-10-09 DIAGNOSIS — N289 Disorder of kidney and ureter, unspecified: Secondary | ICD-10-CM

## 2012-10-12 ENCOUNTER — Other Ambulatory Visit: Payer: No Typology Code available for payment source

## 2012-10-13 ENCOUNTER — Ambulatory Visit
Admission: RE | Admit: 2012-10-13 | Discharge: 2012-10-13 | Disposition: A | Discharge status: Home / Self Care | Payer: Medicaid Other | Source: Ambulatory Visit | Attending: Family Medicine | Admitting: Family Medicine

## 2012-10-13 DIAGNOSIS — N289 Disorder of kidney and ureter, unspecified: Secondary | ICD-10-CM

## 2013-04-05 ENCOUNTER — Other Ambulatory Visit: Payer: Self-pay | Admitting: Family Medicine

## 2013-04-05 DIAGNOSIS — N281 Cyst of kidney, acquired: Secondary | ICD-10-CM

## 2013-04-11 ENCOUNTER — Other Ambulatory Visit: Payer: Medicaid Other

## 2013-09-07 ENCOUNTER — Other Ambulatory Visit: Payer: Medicaid Other

## 2013-09-14 ENCOUNTER — Ambulatory Visit
Admission: RE | Admit: 2013-09-14 | Discharge: 2013-09-14 | Disposition: A | Discharge status: Home / Self Care | Payer: Medicaid Other | Source: Ambulatory Visit | Attending: Family Medicine | Admitting: Family Medicine

## 2013-09-14 DIAGNOSIS — N281 Cyst of kidney, acquired: Secondary | ICD-10-CM

## 2015-04-02 IMAGING — US US RENAL
1 series · 14 of 25 positions shown · non-contrast
Comparison: Outside CT at [facility] is not available for direct
comparison and over port is provided.

CLINICAL DATA: Renal masses on outside prior CT, not available for
comparison.

EXAM:
RENAL/URINARY TRACT ULTRASOUND COMPLETE

[Series 1: us renal · 0.26mm/px · 14 of 35 slices shown]
[im 1/35]
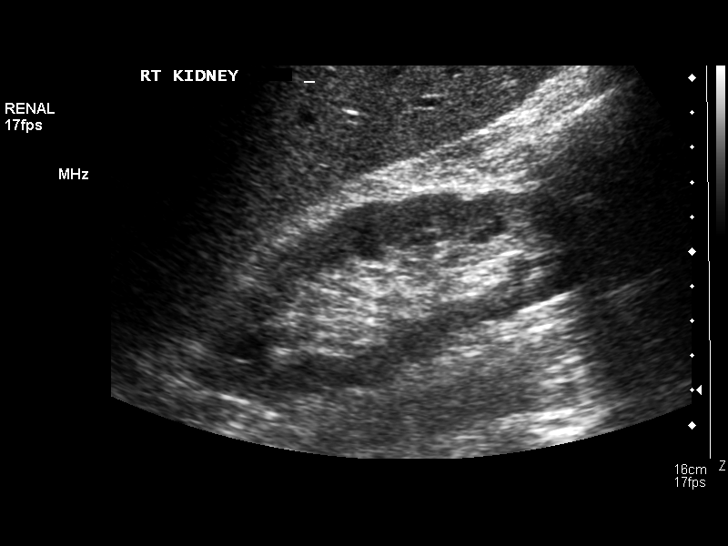
[im 3/35]
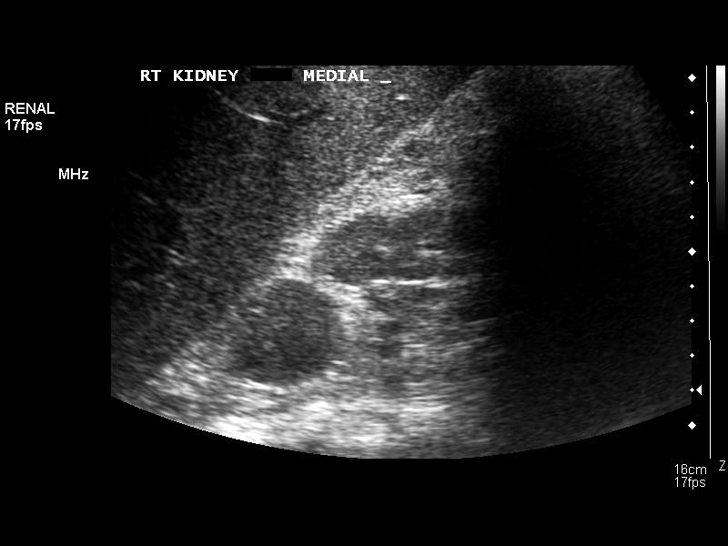
[im 6/35]
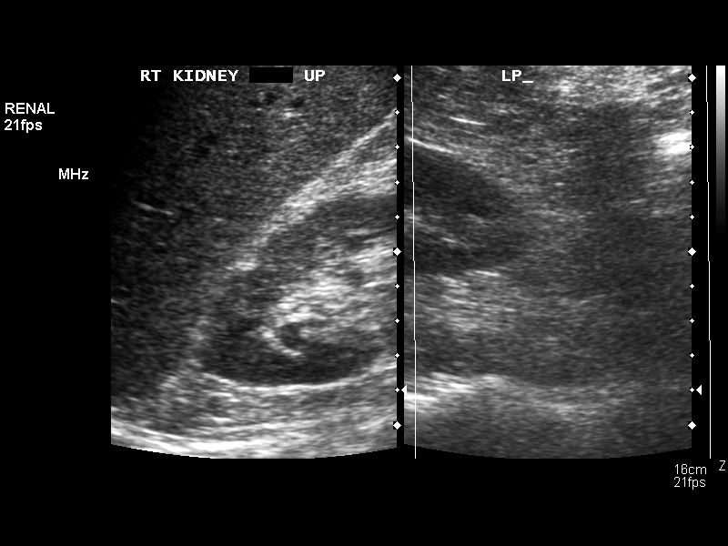
[im 9/35]
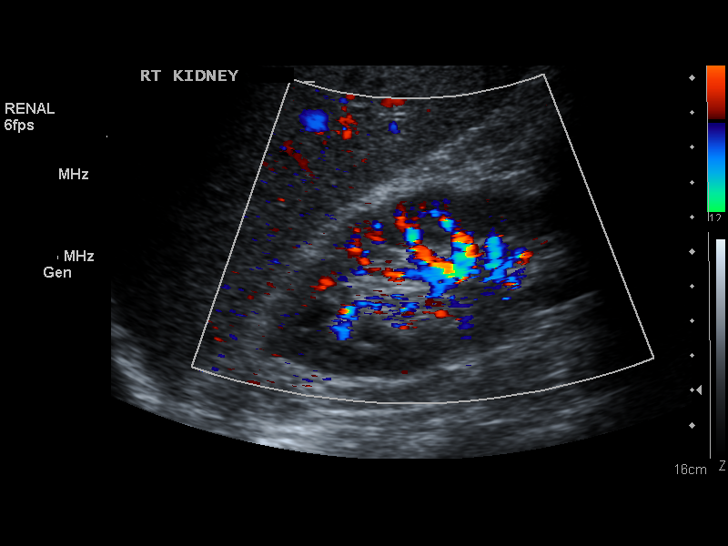
[im 12/35]
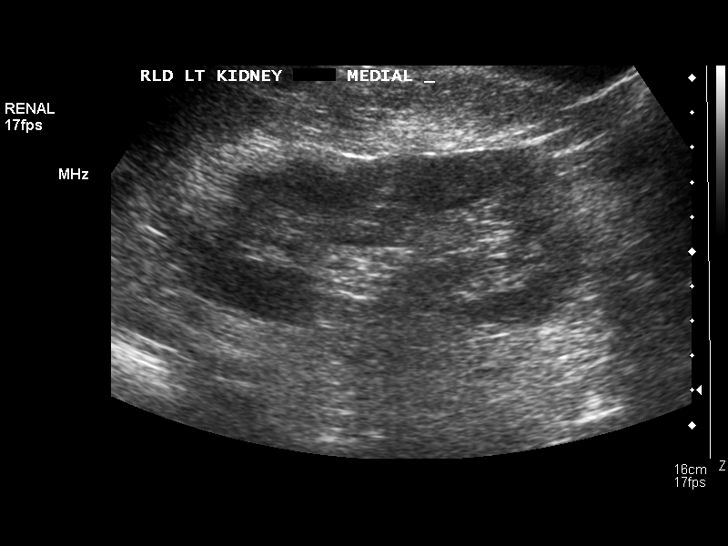
[im 13/35]
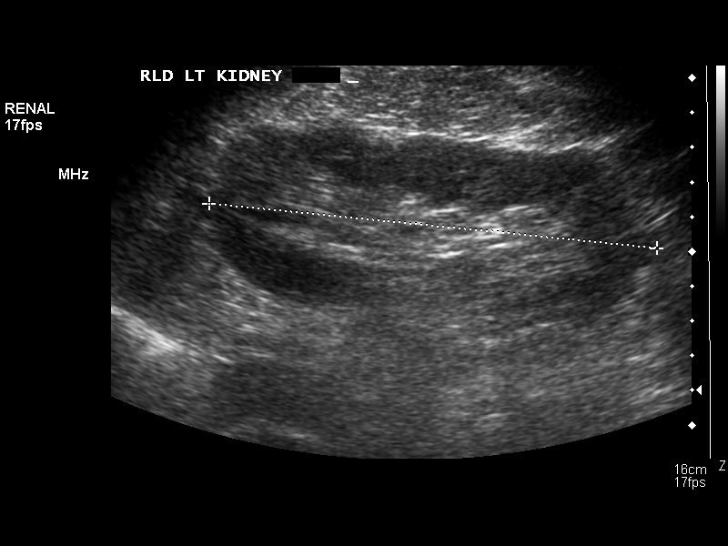
[im 16/35]
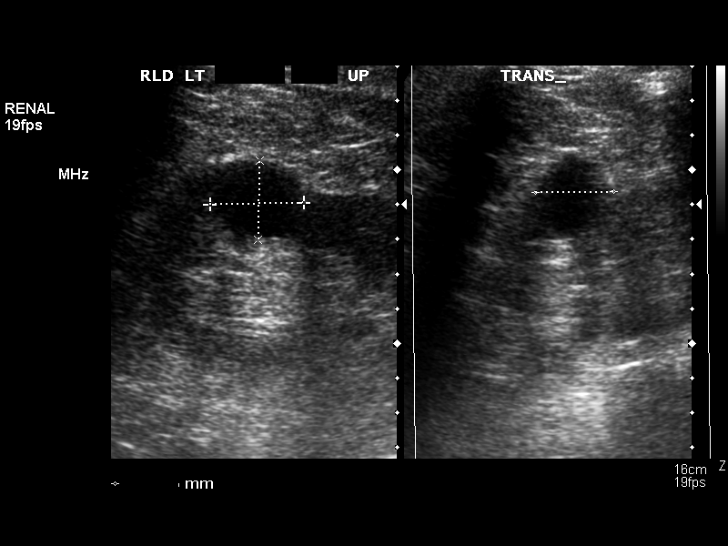
[im 19/35]
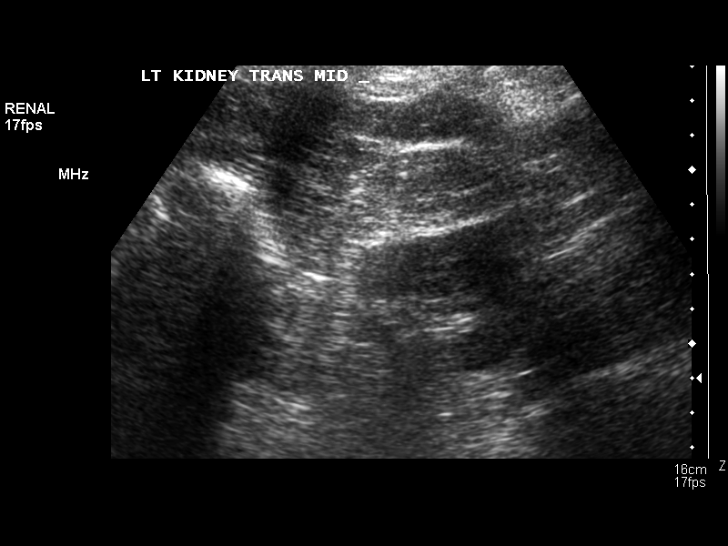
[im 22/35]
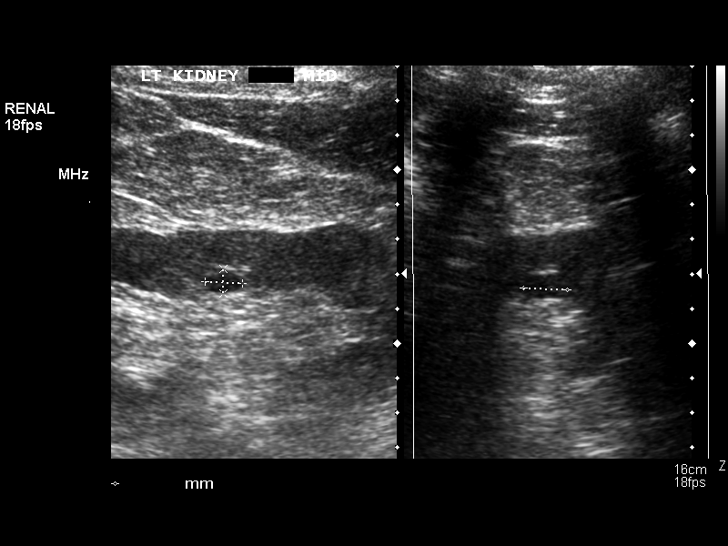
[im 23/35]
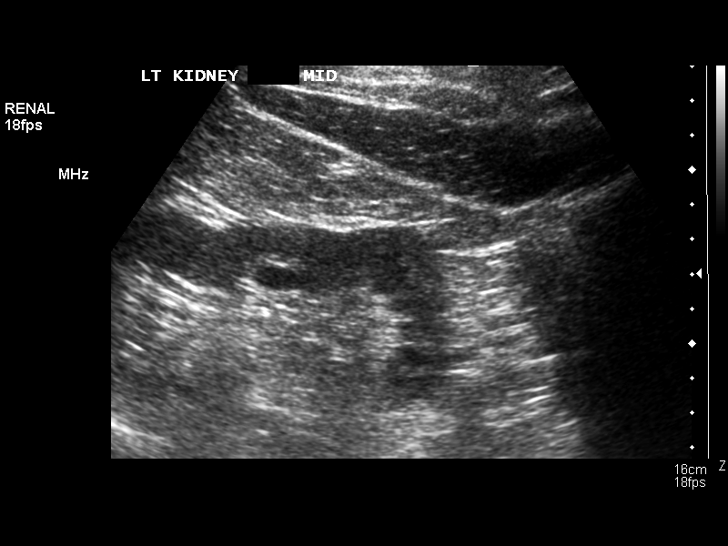
[im 26/35]
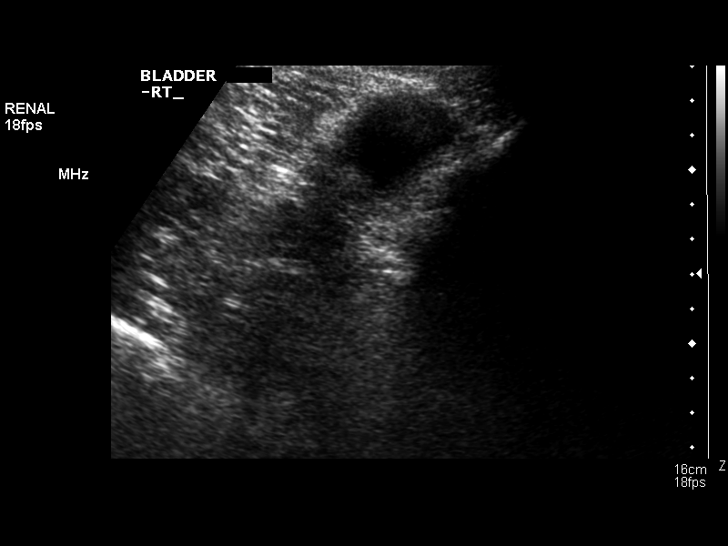
[im 29/35]
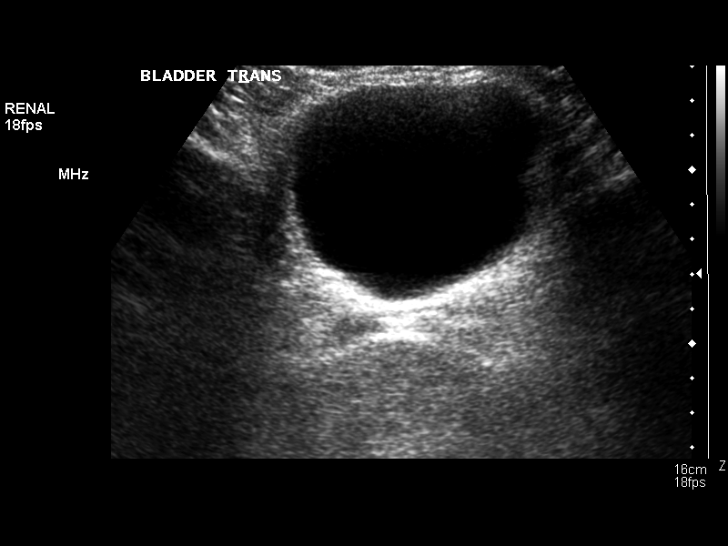
[im 32/35]
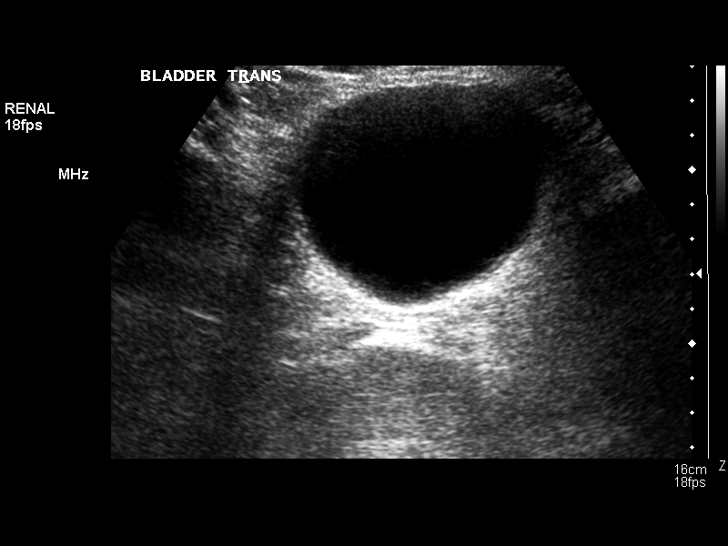
[im 35/35]
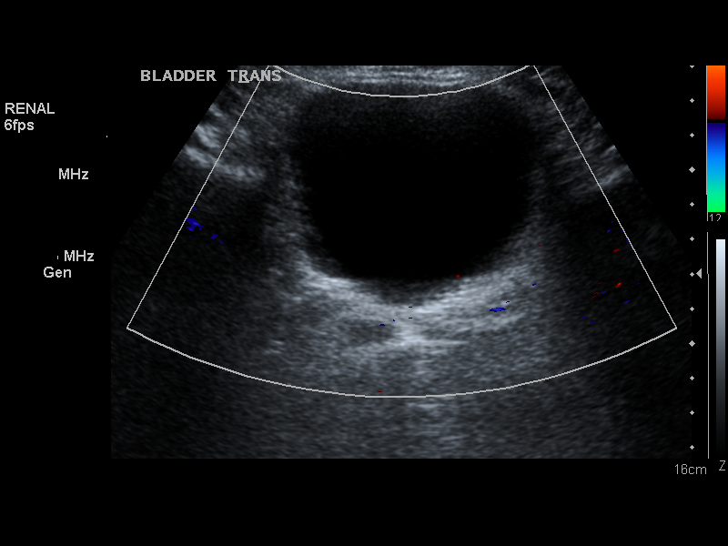

[14 of 25 positions shown; findings below may reference images not displayed]

FINDINGS: Right Kidney

Length: 12.3 cm. Echogenicity within normal limits. No mass or
hydronephrosis visualized.

Left Kidney

Length: 11.6 cm. Essentially simple appearing left upper pole and
mid renal cortical cysts are noted with presumed artifactual
internal echoes, measuring 2.7 x 2.3 x 2.3 cm and 1.3 x 1.1 x
cm, respectively. No hydronephrosis or solid mass.

Bladder:  Appears normal for degree of bladder distention.
IMPRESSION: Probable left renal cortical cysts, not optimally visualized due to
patient body habitus, with likely artifactual internal echoes.
Consider followup renal ultrasound in 6-12 months.

## 2015-12-05 DIAGNOSIS — D696 Thrombocytopenia, unspecified: Secondary | ICD-10-CM | POA: Diagnosis not present

## 2016-03-03 IMAGING — US US RENAL
1 series · 14 of 25 positions shown · non-contrast
Comparison: Renal ultrasound 10/13/2012

CLINICAL DATA: Followup probable renal cyst.

EXAM:
RENAL/URINARY TRACT ULTRASOUND COMPLETE

[Series 1: us renal · 0.29mm/px · 14 of 36 slices shown]
[im 1/36]
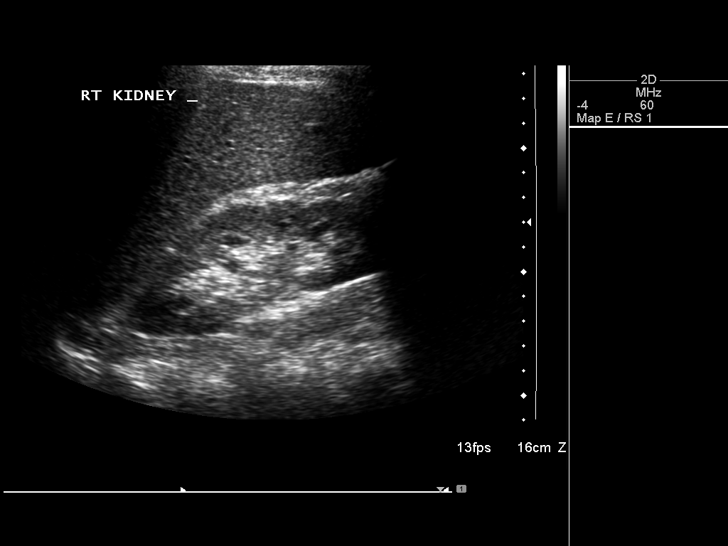
[im 3/36]
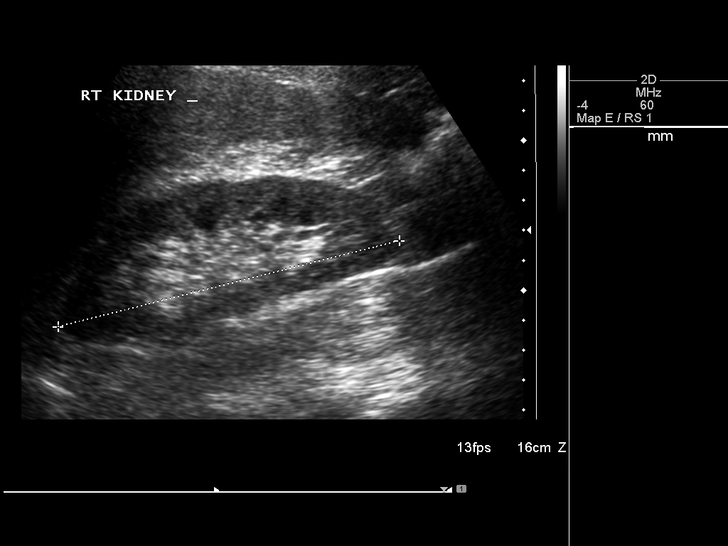
[im 6/36]
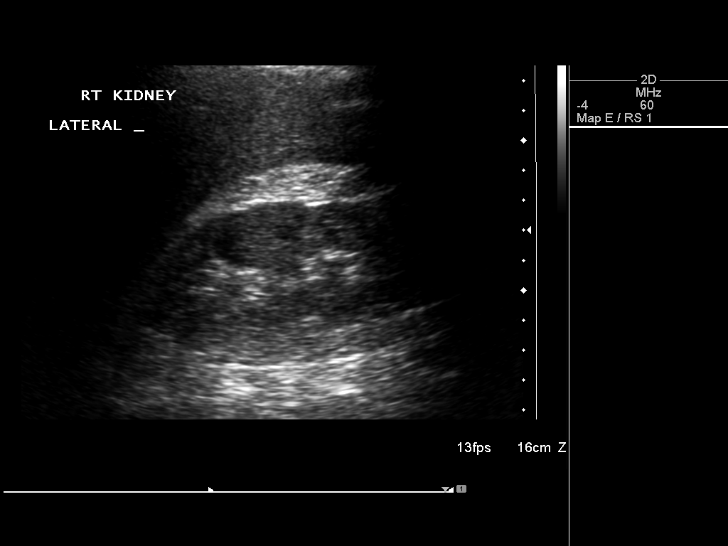
[im 9/36]
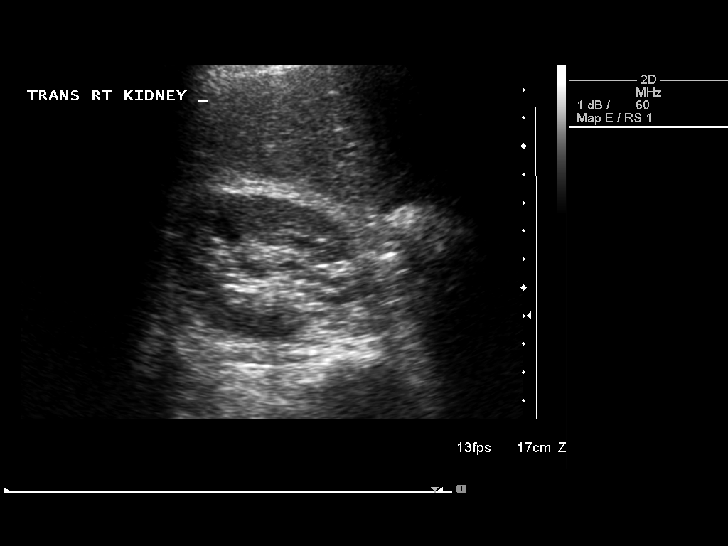
[im 12/36]
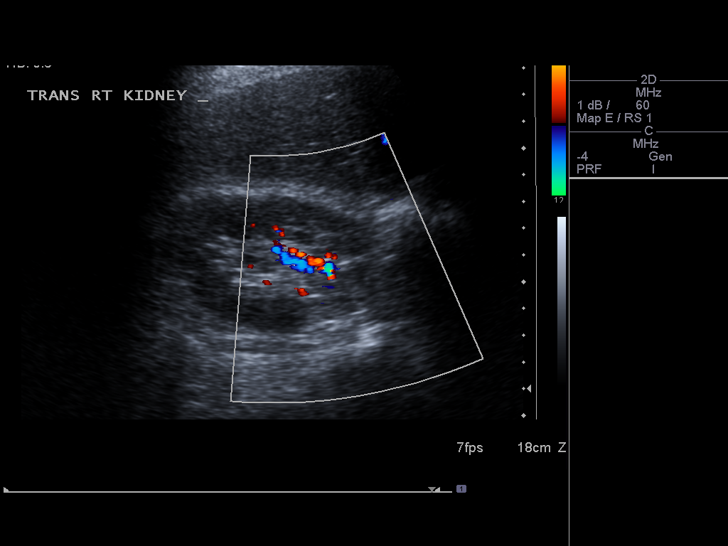
[im 14/36]
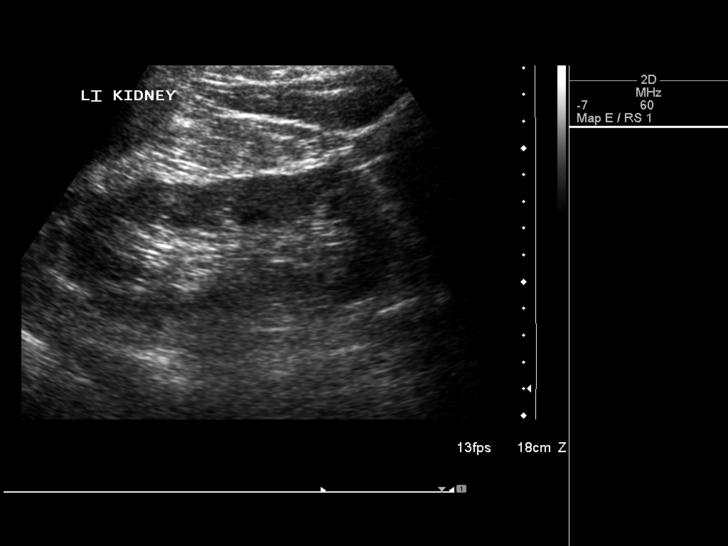
[im 17/36]
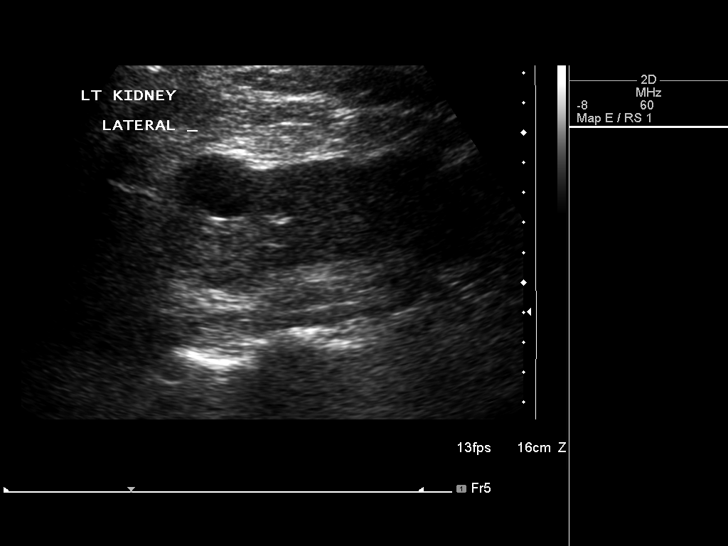
[im 19/36]
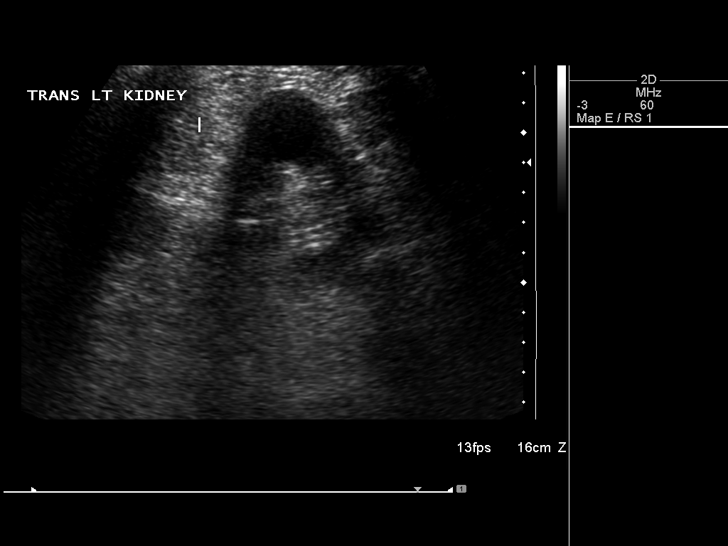
[im 22/36]
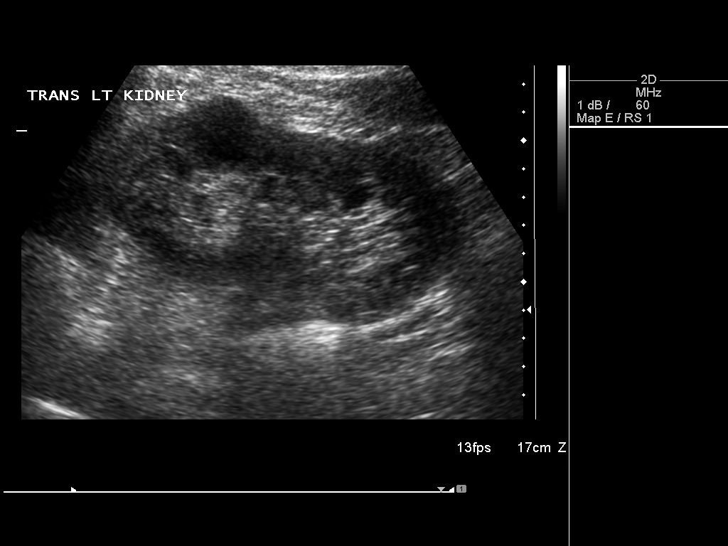
[im 24/36]
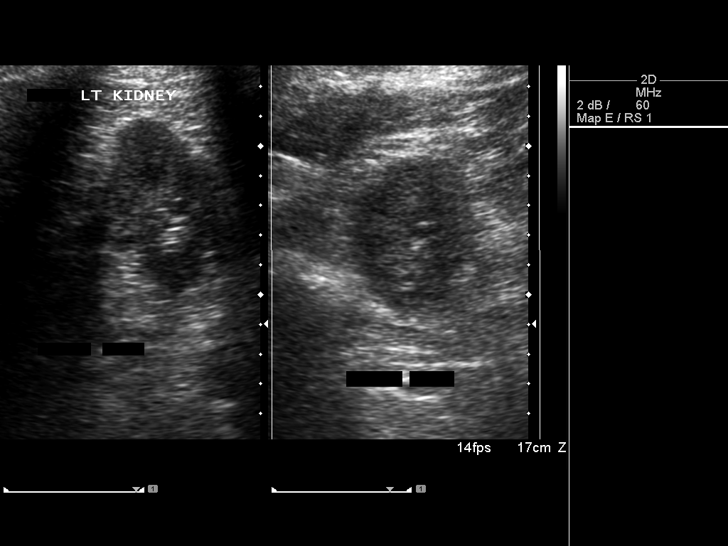
[im 27/36]
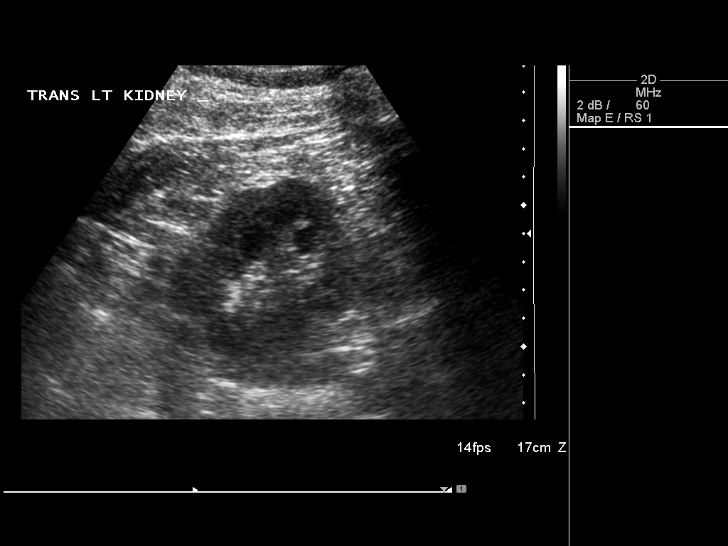
[im 30/36]
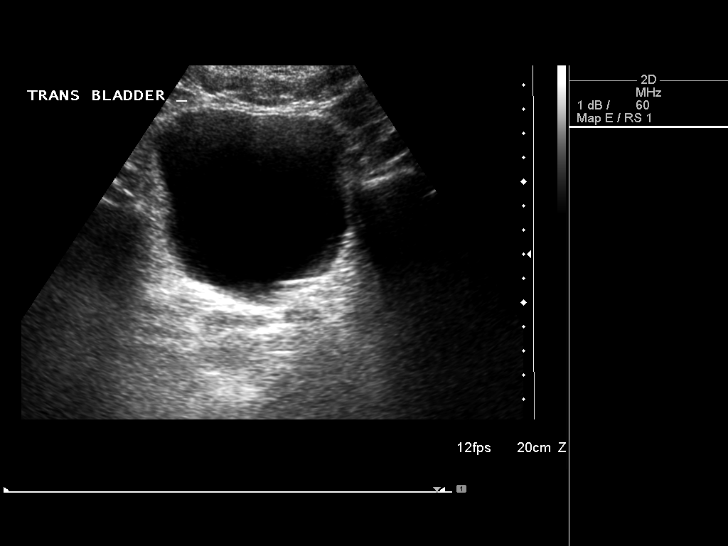
[im 33/36]
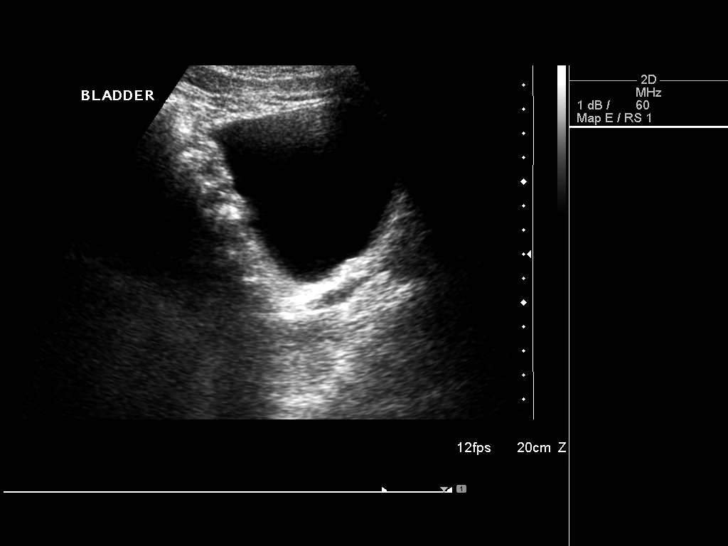
[im 36/36]
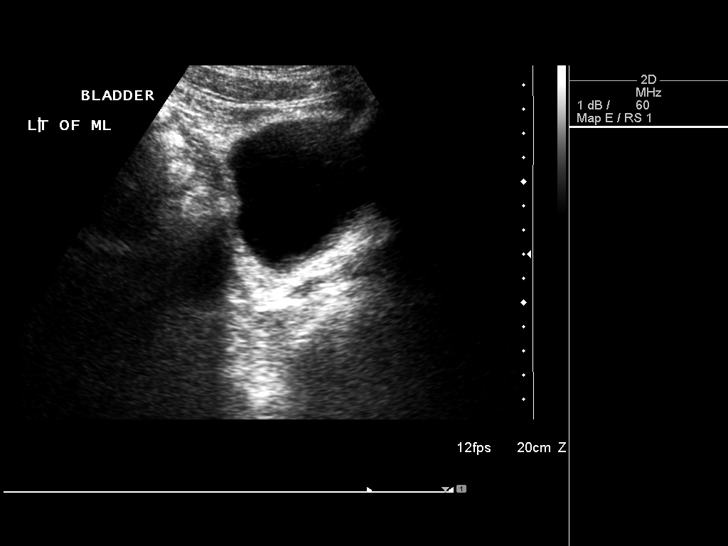

[14 of 25 positions shown; findings below may reference images not displayed]

FINDINGS: Right Kidney:

Length: 11.8 cm. Echogenicity within normal limits. No mass or
hydronephrosis visualized.

Left Kidney:

Length: 12.4 cm. Normal renal cortical thickness and echogenicity.
No hydronephrosis. There is a 2.3 x 2.1 x 2.4 cm hypoechoic mass
within the superior pole of the left kidney, previously measuring
2.7 x 2.3 x 2.3 cm. Grossly unchanged 1.2 cm hypoechoic lesion
within the inferior pole of the left kidney.

Bladder:

Appears normal for degree of bladder distention.
IMPRESSION: No significant interval change in probable left renal cortical
cysts. Consider an additional follow-up renal ultrasound in 12
months to demonstrate 2 years of stability.

## 2017-02-22 ENCOUNTER — Encounter (HOSPITAL_COMMUNITY): Payer: Self-pay

## 2017-02-22 ENCOUNTER — Other Ambulatory Visit: Payer: Self-pay

## 2017-02-22 ENCOUNTER — Emergency Department (HOSPITAL_COMMUNITY)
Admission: EM | Admit: 2017-02-22 | Discharge: 2017-02-22 | Disposition: A | Discharge status: Home / Self Care | Payer: Medicaid Other | Attending: Emergency Medicine | Admitting: Emergency Medicine

## 2017-02-22 DIAGNOSIS — J45909 Unspecified asthma, uncomplicated: Secondary | ICD-10-CM | POA: Diagnosis not present

## 2017-02-22 DIAGNOSIS — F17200 Nicotine dependence, unspecified, uncomplicated: Secondary | ICD-10-CM | POA: Insufficient documentation

## 2017-02-22 DIAGNOSIS — M549 Dorsalgia, unspecified: Secondary | ICD-10-CM | POA: Insufficient documentation

## 2017-02-22 DIAGNOSIS — Z760 Encounter for issue of repeat prescription: Secondary | ICD-10-CM

## 2017-02-22 DIAGNOSIS — Z79899 Other long term (current) drug therapy: Secondary | ICD-10-CM | POA: Diagnosis not present

## 2017-02-22 DIAGNOSIS — G8929 Other chronic pain: Secondary | ICD-10-CM | POA: Diagnosis not present

## 2017-02-22 MED ORDER — HYDROCODONE-ACETAMINOPHEN 5-325 MG PO TABS: 1.0000 | ORAL_TABLET | Freq: Every day | ORAL | 0 refills | Status: DC

## 2017-02-22 MED ORDER — HYDROCODONE-ACETAMINOPHEN 5-325 MG PO TABS: 1.0000 | ORAL_TABLET | Freq: Every day | ORAL | 0 refills | Status: AC
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
## 2017-02-22 NOTE — Discharge Instructions (Addendum)
You were given a short course of Vicodin which you may take once daily until you are seen by your primary provider to refill your prescription.  Do not take more than 1 of these daily.  Do not drive, operate machinery, or work while taking this medication. Please follow-up with your primary provider as soon as possible to refill your prescription.  Return to the ER for any fevers, chest pain, shortness of breath, numbness, weakness, tingling to your bilateral lower extremities, urinary retention, or bowel/bladder incontinence.
